# Patient Record
Sex: Female | Born: 1955 | Race: Black or African American | Hispanic: No | Marital: Married | Smoking: Never smoker
Health system: Southern US, Community
[De-identification: ages and names within clinical notes are randomized; demographics above are authoritative.]

## PROBLEM LIST (undated history)

## (undated) DIAGNOSIS — T148XXA Other injury of unspecified body region, initial encounter: Secondary | ICD-10-CM

---

## 1997-10-11 ENCOUNTER — Encounter: Admission: RE | Admit: 1997-10-11 | Discharge: 1997-10-11 | Payer: Self-pay | Admitting: Infectious Diseases

## 1997-10-25 ENCOUNTER — Encounter: Admission: RE | Admit: 1997-10-25 | Discharge: 1997-10-25 | Payer: Self-pay | Admitting: Infectious Diseases

## 1998-01-08 ENCOUNTER — Encounter: Admission: RE | Admit: 1998-01-08 | Discharge: 1998-01-08 | Payer: Self-pay | Admitting: Infectious Diseases

## 1998-03-21 ENCOUNTER — Encounter: Admission: RE | Admit: 1998-03-21 | Discharge: 1998-03-21 | Payer: Self-pay | Admitting: Infectious Diseases

## 1998-04-11 ENCOUNTER — Ambulatory Visit (HOSPITAL_COMMUNITY): Admission: RE | Admit: 1998-04-11 | Discharge: 1998-04-11 | Payer: Self-pay | Admitting: Infectious Diseases

## 1998-04-11 ENCOUNTER — Encounter: Admission: RE | Admit: 1998-04-11 | Discharge: 1998-04-11 | Payer: Self-pay | Admitting: Infectious Diseases

## 1999-04-04 ENCOUNTER — Other Ambulatory Visit: Admission: RE | Admit: 1999-04-04 | Discharge: 1999-04-04 | Payer: Self-pay | Admitting: Internal Medicine

## 2000-06-04 ENCOUNTER — Other Ambulatory Visit: Admission: RE | Admit: 2000-06-04 | Discharge: 2000-06-04 | Payer: Self-pay | Admitting: Internal Medicine

## 2003-01-05 ENCOUNTER — Emergency Department (HOSPITAL_COMMUNITY): Admission: EM | Admit: 2003-01-05 | Discharge: 2003-01-05 | Payer: Self-pay | Admitting: Emergency Medicine

## 2003-01-05 ENCOUNTER — Encounter: Payer: Self-pay | Admitting: Emergency Medicine

## 2003-01-07 ENCOUNTER — Encounter: Payer: Self-pay | Admitting: Emergency Medicine

## 2003-01-07 ENCOUNTER — Inpatient Hospital Stay (HOSPITAL_COMMUNITY): Admission: EM | Admit: 2003-01-07 | Discharge: 2003-01-13 | Payer: Self-pay | Admitting: Emergency Medicine

## 2004-10-14 ENCOUNTER — Encounter: Admission: RE | Admit: 2004-10-14 | Discharge: 2004-10-14 | Payer: Self-pay | Admitting: Internal Medicine

## 2005-03-26 ENCOUNTER — Ambulatory Visit (HOSPITAL_COMMUNITY): Admission: RE | Admit: 2005-03-26 | Discharge: 2005-03-26 | Payer: Self-pay | Admitting: Internal Medicine

## 2006-04-28 ENCOUNTER — Emergency Department (HOSPITAL_COMMUNITY): Admission: EM | Admit: 2006-04-28 | Discharge: 2006-04-28 | Payer: Self-pay | Admitting: Emergency Medicine

## 2010-07-21 ENCOUNTER — Encounter: Payer: Self-pay | Admitting: Internal Medicine

## 2010-08-14 ENCOUNTER — Other Ambulatory Visit: Payer: Self-pay | Admitting: Internal Medicine

## 2010-08-14 DIAGNOSIS — Z1231 Encounter for screening mammogram for malignant neoplasm of breast: Secondary | ICD-10-CM

## 2010-08-22 ENCOUNTER — Ambulatory Visit: Payer: Self-pay

## 2019-09-08 ENCOUNTER — Other Ambulatory Visit: Payer: Self-pay

## 2019-09-08 ENCOUNTER — Encounter (HOSPITAL_COMMUNITY): Payer: Self-pay

## 2019-09-08 ENCOUNTER — Emergency Department (HOSPITAL_COMMUNITY): Payer: No Typology Code available for payment source

## 2019-09-08 ENCOUNTER — Emergency Department (HOSPITAL_COMMUNITY)
Admission: EM | Admit: 2019-09-08 | Discharge: 2019-09-08 | Disposition: A | Discharge status: Home / Self Care | Payer: No Typology Code available for payment source | Attending: Emergency Medicine | Admitting: Emergency Medicine

## 2019-09-08 DIAGNOSIS — M25511 Pain in right shoulder: Secondary | ICD-10-CM | POA: Insufficient documentation

## 2019-09-08 DIAGNOSIS — Y92410 Unspecified street and highway as the place of occurrence of the external cause: Secondary | ICD-10-CM | POA: Diagnosis not present

## 2019-09-08 DIAGNOSIS — Y999 Unspecified external cause status: Secondary | ICD-10-CM | POA: Insufficient documentation

## 2019-09-08 DIAGNOSIS — Y9389 Activity, other specified: Secondary | ICD-10-CM | POA: Insufficient documentation

## 2019-09-08 DIAGNOSIS — M542 Cervicalgia: Secondary | ICD-10-CM | POA: Insufficient documentation

## 2019-09-08 DIAGNOSIS — M545 Low back pain: Secondary | ICD-10-CM | POA: Insufficient documentation

## 2019-09-08 HISTORY — DX: Other injury of unspecified body region, initial encounter: T14.8XXA

## 2019-09-08 MED ORDER — ACETAMINOPHEN 325 MG PO TABS
650.0000 mg | ORAL_TABLET | Freq: Once | ORAL | Status: AC
  Administered 2019-09-08: 650 mg via ORAL
  Filled 2019-09-08: qty 2

## 2019-09-08 MED ORDER — LIDOCAINE 5 % EX PTCH
1.0000 | MEDICATED_PATCH | Freq: Once | CUTANEOUS | Status: DC
Start: 2019-09-08 — End: 2019-09-08
  Administered 2019-09-08: 1 via TRANSDERMAL
  Filled 2019-09-08: qty 1

## 2019-09-08 MED ORDER — NAPROXEN 500 MG PO TABS: 500.0000 mg | ORAL_TABLET | Freq: Two times a day (BID) | ORAL | 0 refills | Status: AC

## 2019-09-08 MED ORDER — CYCLOBENZAPRINE HCL 10 MG PO TABS: 10.0000 mg | ORAL_TABLET | Freq: Two times a day (BID) | ORAL | 0 refills | Status: AC | PRN
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
## 2019-09-08 NOTE — ED Notes (Signed)
An After Visit Summary was printed and given to the patient. Discharge instructions given and no further questions at this time.  
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
